# Patient Record
Sex: Male | Born: 1990 | Race: White | Hispanic: No | Marital: Single | Smoking: Current some day smoker
Health system: Southern US, Community
[De-identification: ages and names within clinical notes are randomized; demographics above are authoritative.]

---
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
## 2010-12-03 IMAGING — CR DG FINGER THUMB 2+V*R*
3 series · 3 of 3 positions shown · non-contrast
Comparison: None

CLINICAL DATA: Injury.  Pain.

RIGHT THUMB 2+V

[x finger obl. right]
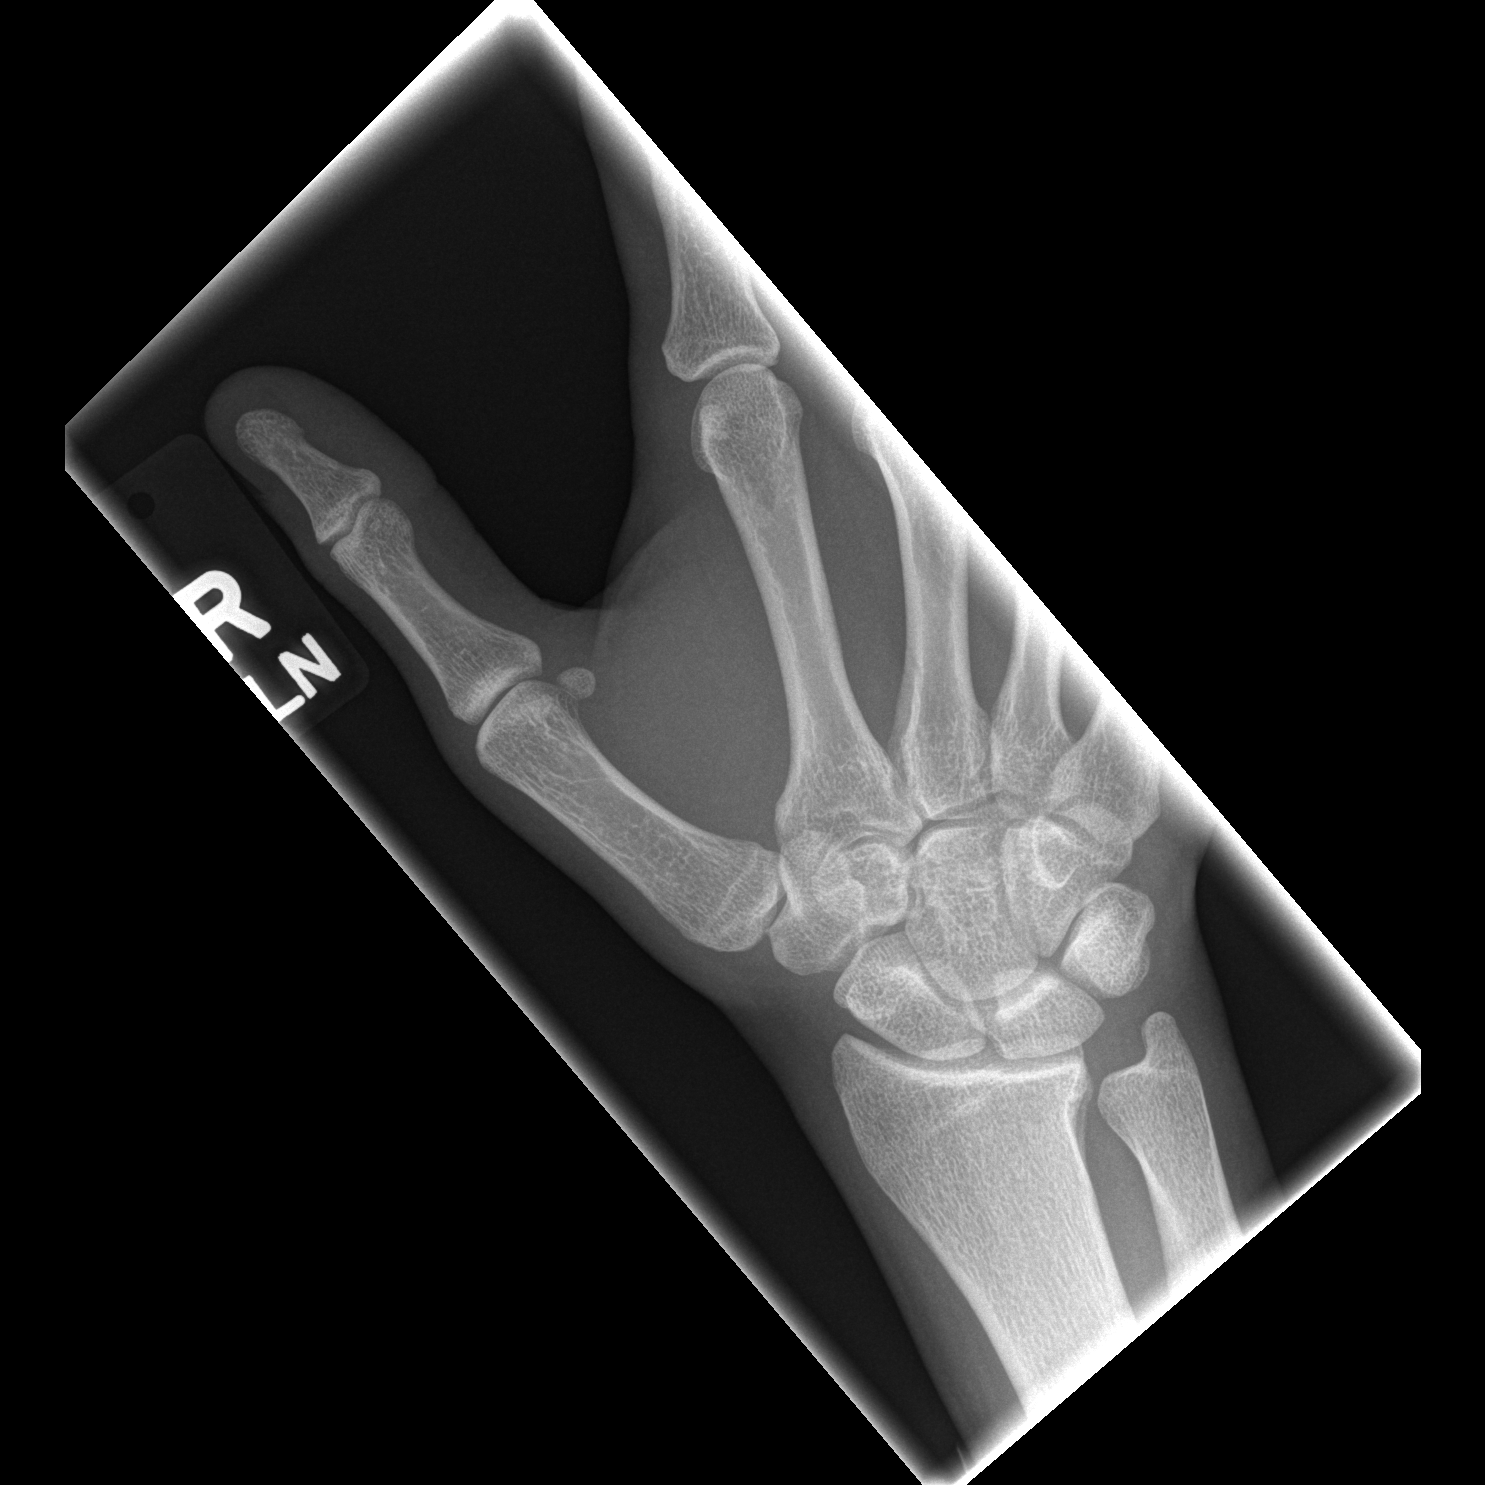

[x finger lateral right]
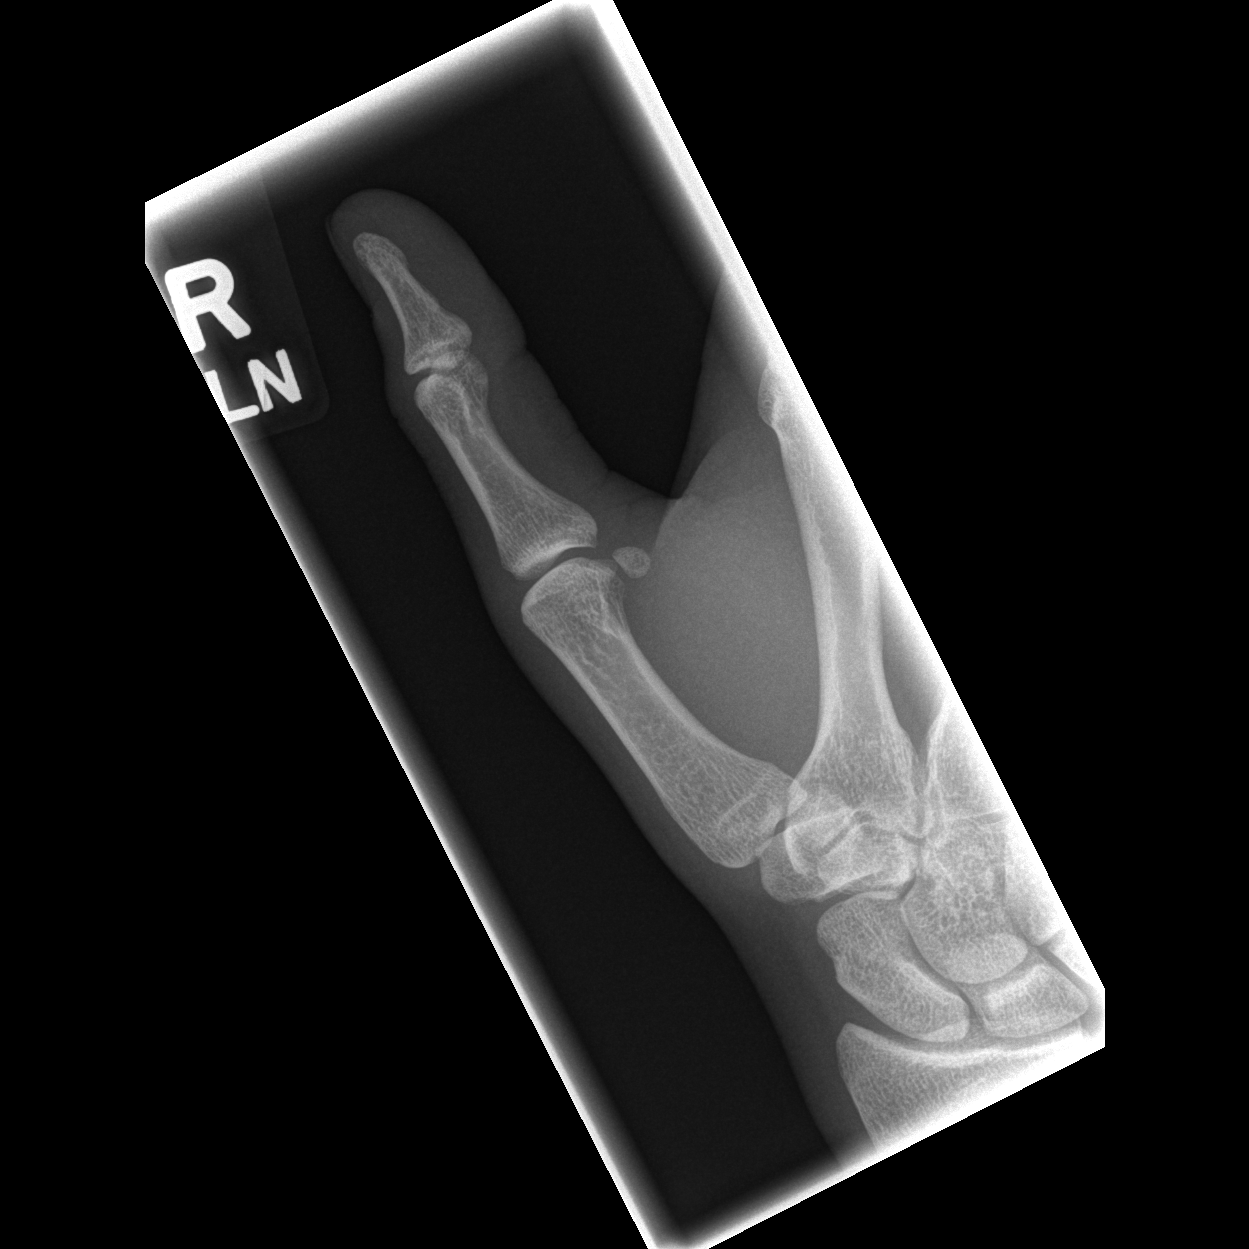

[x finger pa right]
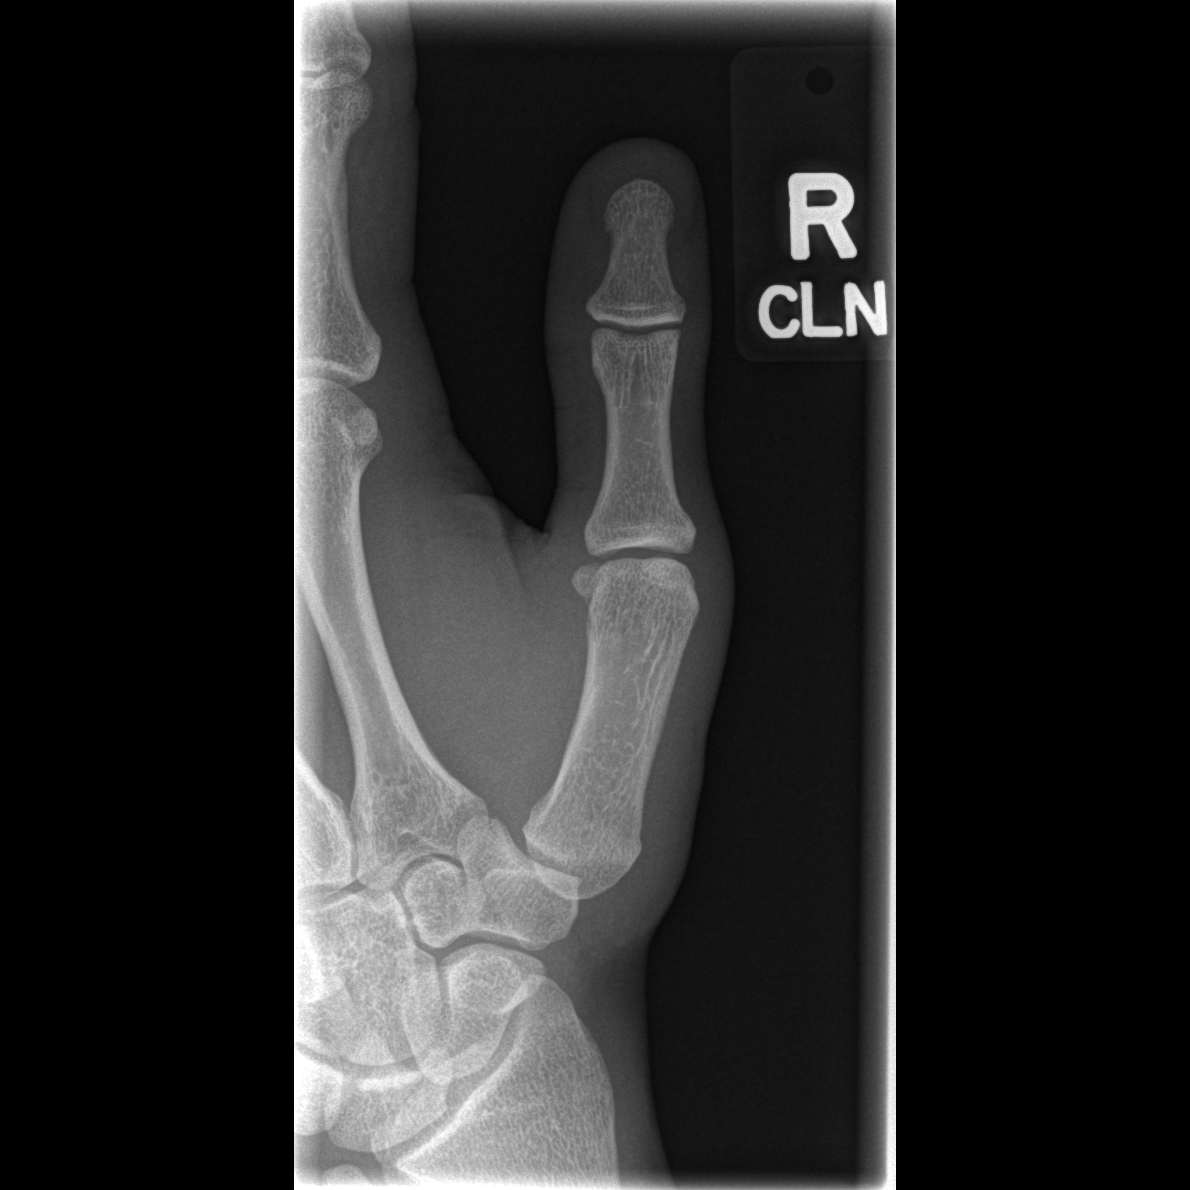

[3 of 3 positions shown; findings below may reference images not displayed]

FINDINGS: There is no evidence of fracture or dislocation.  There
is no evidence of arthropathy or other focal bone abnormality.
Soft tissues are unremarkable.
IMPRESSION: Negative.

## 2023-04-25 NOTE — Progress Notes (Signed)
Rubin Payor, PhD, LAT, ATC acting as a scribe for Clementeen Graham, MD.  Christopher Vasquez is a 32 y.o. male who presents to [facility] today for shoulder pain. He is being referred to [place] by Cornelius Moras, at [facility]. Pt c/o shoulder pain x 5 days. Pt locates pain to left shoulder. Pain at lateral aspect. Also having pain wrapping aroud the the back of the shoulder. Tenderness at the front of the shoulder. Restricted ROM, worse with lateral raises. Mild radiating pain into the arms. Mechanical sx present. Sx causing night disturbance. Sx are constant. 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   Pain is severe  Radiates: yes Aggravates: palpation at anterior delt Treatments tried: PT, Aleve, IBU, topical creams, ice - no relief  Pertinent review of systems: No fevers or chills  Relevant historical information: Not much current medical history.  Patient was recently incarcerated.   Exam:  BP 130/80   Pulse 77   Ht 5\' 9"  (1.753 m)   Wt 178 lb (80.7 kg)   SpO2 98%   BMI 26.29 kg/m  General: Well Developed, well nourished, and in no acute distress.   MSK: Left shoulder: Normal-appearing Nontender. Very limited range of motion with significant pain. Strength difficult to test but generally intact within limits of motion. Significant guarding with exam.    Lab and Radiology Results  Procedure: Real-time Ultrasound Guided Injection of left shoulder subacromial bursa Device: Philips Affiniti 50G Images permanently stored and available for review in PACS Ultrasound evaluation prior to injection reveals mild hypoechoic fluid within the biceps tendon sheath.  Biceps tendon is intact. Subscapularis rotator cuff tendon intact without visible tear per Supraspinatus tendon appears to be intact without retracted tear.  Moderate calcific change present in mid tendon indicating chronic calcific tendinitis. Infraspinatus tendon also appears to be  intact without retracted tear. AC joint mild effusion Verbal informed consent obtained.  Discussed risks and benefits of procedure. Warned about infection, bleeding, hyperglycemia damage to structures among others. Patient expresses understanding and agreement Time-out conducted.   Noted no overlying erythema, induration, or other signs of local infection.   Skin prepped in a sterile fashion.   Local anesthesia: Topical Ethyl chloride.   With sterile technique and under real time ultrasound guidance: 40 mg of Kenalog and 2 mL of Marcaine injected into subacromial space. Fluid seen entering the bursa.   Completed without difficulty   Pain moderately  resolved suggesting accurate placement of the medication.   Advised to call if fevers/chills, erythema, induration, drainage, or persistent bleeding.   Images permanently stored and available for review in the ultrasound unit.  Impression: Technically successful ultrasound guided injection.    X-ray images left shoulder obtained today personally and independently interpreted Calcific density present at superior humeral head consistent with chronic calcific tendinopathy.  No fractures or severe arthritis is visible.  No dislocation present. Await formal radiology review     Assessment and Plan: 32 y.o. male with severe left shoulder pain after lifting weights.  Unclear etiology for pain.  He does have some chronic calcific tendinopathy changes visible on ultrasound but I do not expect this should be bad enough to cause the pain he is experiencing.  Plan for subacromial injection and x-ray.  Limited hydrocodone for pain control.  Discussed appropriate doses of NSAIDs and Tylenol with hydrocodone.  If not better by Tuesday next week could do a glenohumeral injection anytime.  Will communicate  with referring physical therapy at [facility].   PDMP reviewed during this encounter. Orders Placed This Encounter  Procedures   DG Shoulder Left     Standing Status:   Future    Number of Occurrences:   1    Standing Expiration Date:   05/27/2023    Order Specific Question:   Reason for Exam (SYMPTOM  OR DIAGNOSIS REQUIRED)    Answer:   left shoulder pain    Order Specific Question:   Preferred imaging location?    Answer:   [facility]   [place] LIMITED JOINT SPACE STRUCTURES UP LEFT(NO LINKED CHARGES)    Order Specific Question:   Reason for Exam (SYMPTOM  OR DIAGNOSIS REQUIRED)    Answer:   left shoulder pain    Order Specific Question:   Preferred imaging location?    Answer:   [facility] ordered this encounter  Medications   HYDROcodone-acetaminophen (NORCO/VICODIN) 5-325 MG tablet    Sig: Take 1 tablet by mouth every 6 (six) hours as needed.    Dispense:  15 tablet    Refill:  0     Discussed warning signs or symptoms. Please see discharge instructions. Patient expresses understanding.   The above documentation has been reviewed and is accurate and complete Clementeen Graham, M.D.

## 2023-04-26 ENCOUNTER — Other Ambulatory Visit: Payer: Self-pay

## 2023-04-26 ENCOUNTER — Ambulatory Visit (INDEPENDENT_AMBULATORY_CARE_PROVIDER_SITE_OTHER): Payer: Self-pay

## 2023-04-26 ENCOUNTER — Encounter: Payer: Self-pay | Admitting: Family Medicine

## 2023-04-26 ENCOUNTER — Ambulatory Visit: Payer: Self-pay | Admitting: Family Medicine

## 2023-04-26 VITALS — BP 130/80 | HR 77 | Ht 69.0 in | Wt 178.0 lb

## 2023-04-26 DIAGNOSIS — M25512 Pain in left shoulder: Secondary | ICD-10-CM

## 2023-04-26 DIAGNOSIS — G8929 Other chronic pain: Secondary | ICD-10-CM

## 2023-04-26 MED ORDER — HYDROCODONE-ACETAMINOPHEN 5-325 MG PO TABS: 1.0000 | ORAL_TABLET | Freq: Four times a day (QID) | ORAL | 0 refills | Status: AC | PRN

## 2023-04-26 NOTE — Patient Instructions (Signed)
Thank you for coming in today.   Use hydrocodone sparingly.   Max dose tylenol is 1000mg  every 6 hours.  The hydrocodone has 325mg  of tylneol in it.   Ok to take with aleve 2 pills every 12 hours or ibuprofen 4 pills every 8 hours.   Let me know Tuesday if this is not working.   Please get an Xray today before you leave

## 2023-04-30 NOTE — Progress Notes (Signed)
Left shoulder x-ray shows calcific tendinitis in the rotator cuff.
